# Patient Record
Sex: Female | Born: 1985
Health system: Southern US, Community
[De-identification: ages and names within clinical notes are randomized; demographics above are authoritative.]

## PROBLEM LIST (undated history)

## (undated) DIAGNOSIS — F419 Anxiety disorder, unspecified: Secondary | ICD-10-CM

## (undated) DIAGNOSIS — R519 Headache, unspecified: Secondary | ICD-10-CM

## (undated) DIAGNOSIS — R51 Headache: Secondary | ICD-10-CM

## (undated) HISTORY — PX: WISDOM TOOTH EXTRACTION: SHX21

## (undated) HISTORY — PX: DILATION AND CURETTAGE OF UTERUS: SHX78

## (undated) HISTORY — PX: UPPER GI ENDOSCOPY: SHX6162

---

## 2004-06-15 ENCOUNTER — Emergency Department (HOSPITAL_COMMUNITY): Admission: EM | Admit: 2004-06-15 | Discharge: 2004-06-15 | Payer: Self-pay | Admitting: Emergency Medicine

## 2005-04-01 ENCOUNTER — Emergency Department (HOSPITAL_COMMUNITY): Admission: EM | Admit: 2005-04-01 | Discharge: 2005-04-01 | Payer: Self-pay | Admitting: Emergency Medicine

## 2006-02-09 IMAGING — CR DG CHEST 2V
2 series · 2 of 2 positions shown · non-contrast
Comparison: none

CLINICAL DATA: Cough.
CHEST-TWO VIEW: 
No evidence of infiltrate, edema, or pleural effusion.  Heart size and mediastinal contours are within normal limits. Vertebral bodies in the lateral projection show some increase in density as well as irregular endplates.  Correlation suggested with any underlying osteodystrophy such as secondary to sickle cell anemia or renal disease.

[view not recorded (1 of 2)]
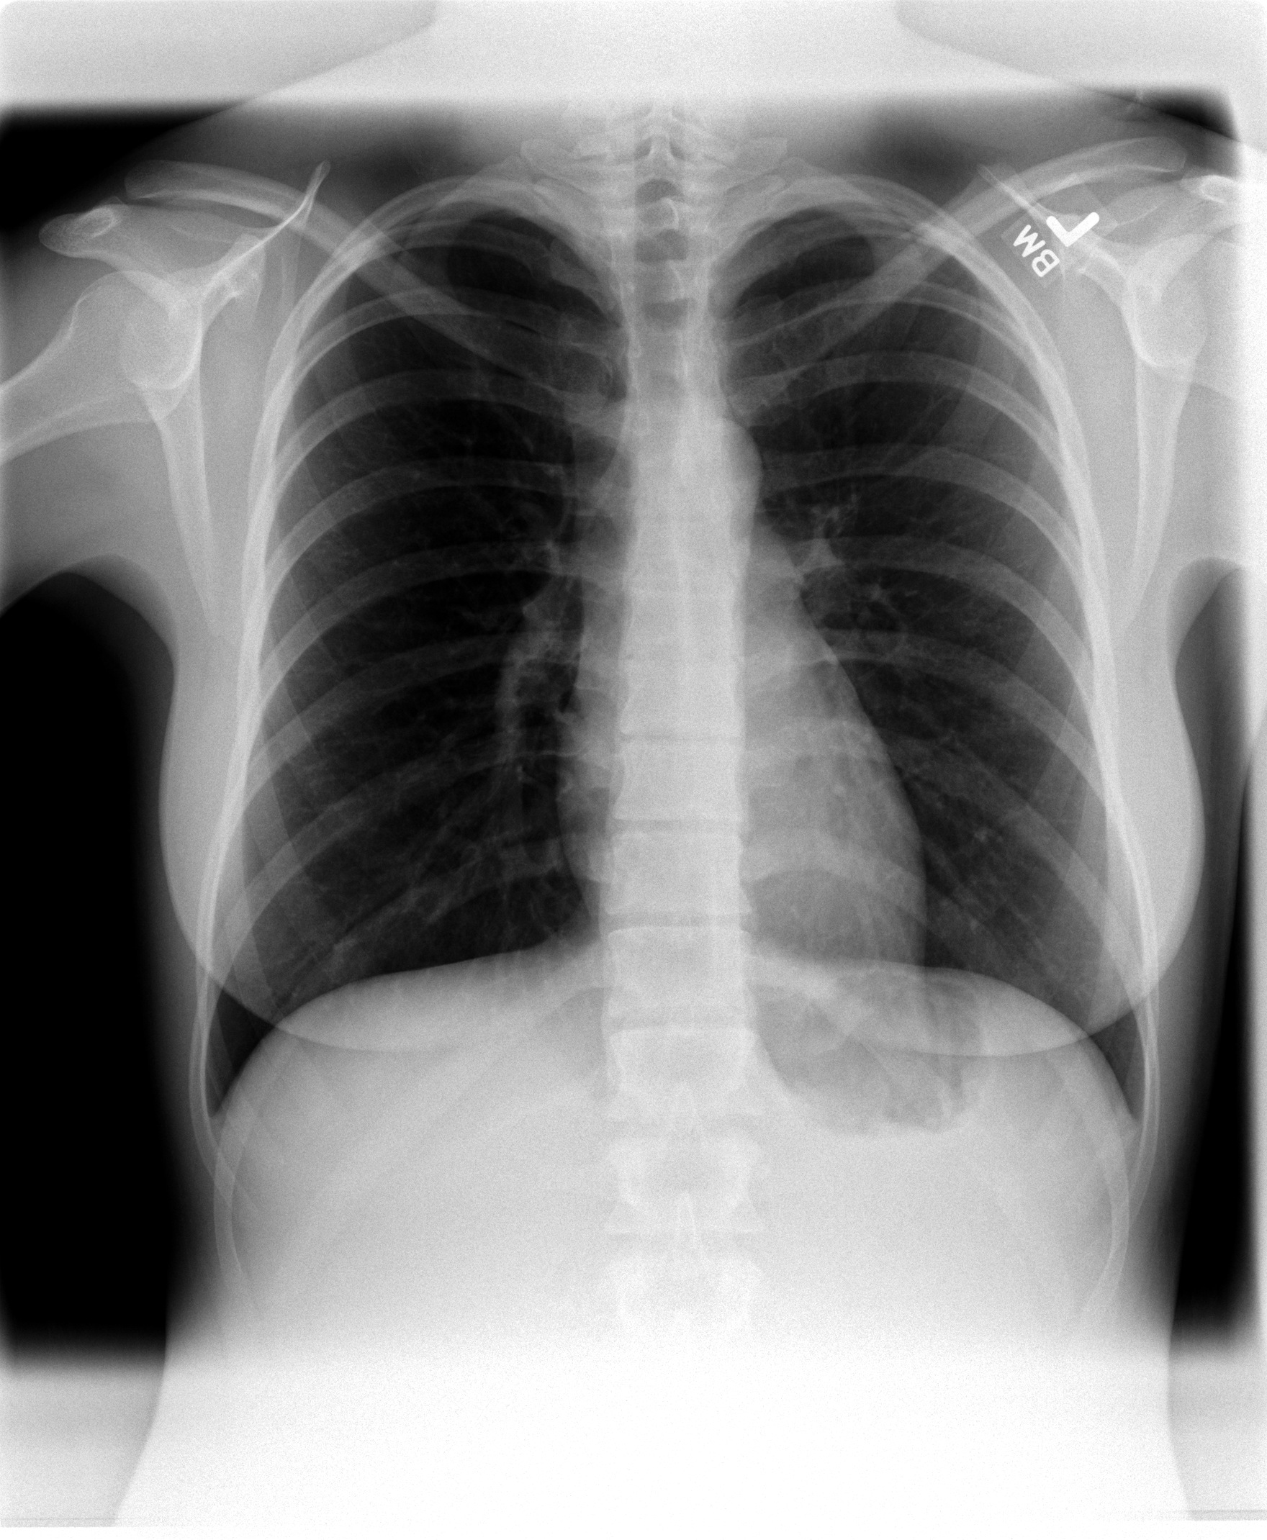

[view not recorded (2 of 2)]
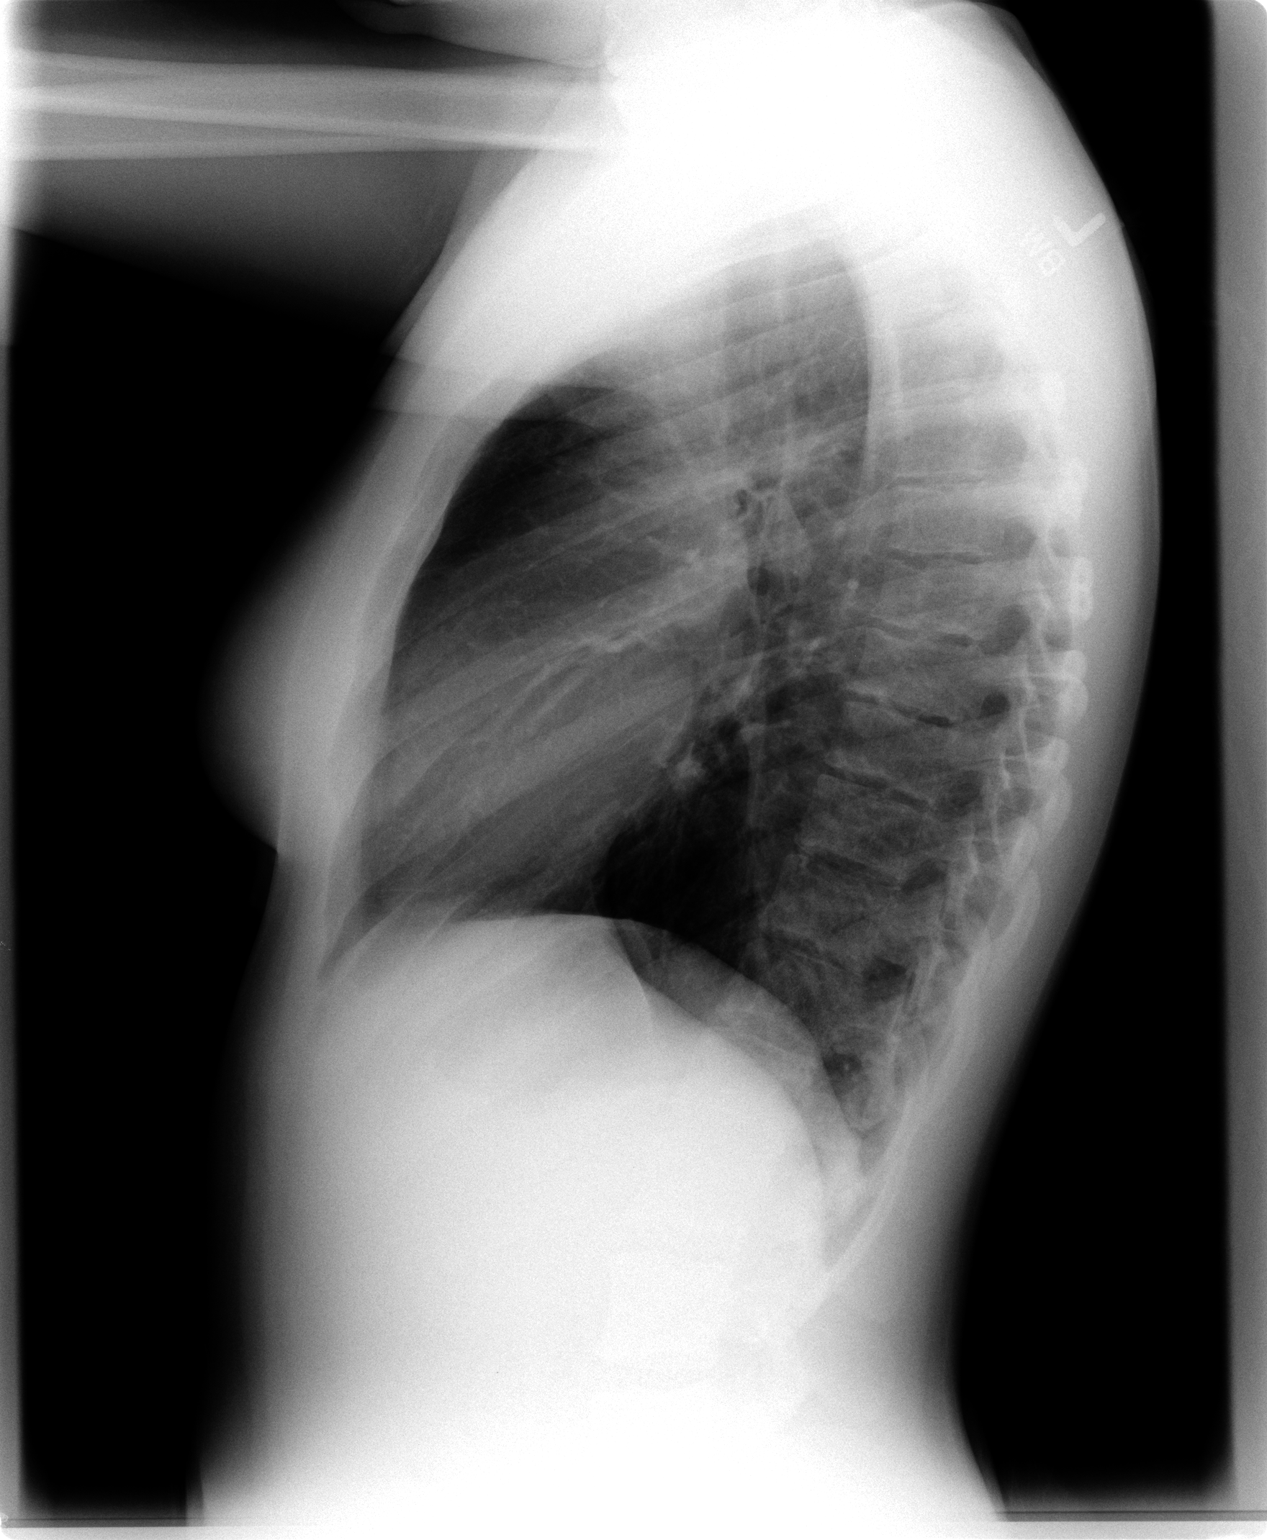

[2 of 2 positions shown; findings below may reference images not displayed]

IMPRESSION: No active disease in the chest.  Some irregularity and increased density of vertebral bodies noted in the thoracic spine. This does raise the possibility of underlying osteodystrophy.

## 2016-08-18 ENCOUNTER — Telehealth: Payer: Self-pay

## 2016-08-18 NOTE — Telephone Encounter (Signed)
SENT NOTES TO SCHEDULING 

## 2017-08-11 ENCOUNTER — Other Ambulatory Visit: Payer: Self-pay | Admitting: Nurse Practitioner

## 2017-08-11 ENCOUNTER — Other Ambulatory Visit (HOSPITAL_COMMUNITY)
Admission: RE | Admit: 2017-08-11 | Discharge: 2017-08-11 | Disposition: A | Discharge status: Home / Self Care | Payer: BLUE CROSS/BLUE SHIELD | Source: Ambulatory Visit | Attending: Nurse Practitioner | Admitting: Nurse Practitioner

## 2017-08-11 DIAGNOSIS — Z01419 Encounter for gynecological examination (general) (routine) without abnormal findings: Secondary | ICD-10-CM | POA: Diagnosis not present

## 2017-08-17 LAB — CYTOLOGY - PAP
CHLAMYDIA, DNA PROBE: NEGATIVE
DIAGNOSIS: UNDETERMINED — AB
HPV 16/18/45 genotyping: POSITIVE — AB
HPV: DETECTED — AB
Neisseria Gonorrhea: NEGATIVE

## 2017-08-19 ENCOUNTER — Other Ambulatory Visit: Payer: Self-pay | Admitting: Nurse Practitioner

## 2017-08-24 ENCOUNTER — Other Ambulatory Visit: Payer: Self-pay | Admitting: Obstetrics & Gynecology

## 2017-09-16 ENCOUNTER — Other Ambulatory Visit: Payer: Self-pay | Admitting: Obstetrics & Gynecology

## 2017-10-10 ENCOUNTER — Encounter (HOSPITAL_COMMUNITY): Payer: Self-pay | Admitting: *Deleted

## 2017-10-10 ENCOUNTER — Other Ambulatory Visit: Payer: Self-pay
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
## 2017-11-08 ENCOUNTER — Encounter (HOSPITAL_COMMUNITY): Payer: Self-pay | Admitting: Anesthesiology

## 2017-11-09 ENCOUNTER — Ambulatory Visit (HOSPITAL_COMMUNITY)
Admission: AD | Admit: 2017-11-09 | Payer: BLUE CROSS/BLUE SHIELD | Source: Ambulatory Visit | Admitting: Obstetrics & Gynecology

## 2017-11-09 HISTORY — DX: Headache, unspecified: R51.9

## 2017-11-09 HISTORY — DX: Anxiety disorder, unspecified: F41.9

## 2017-11-09 HISTORY — DX: Headache: R51

## 2017-11-09 SURGERY — DILATATION & CURETTAGE/HYSTEROSCOPY WITH HYDROTHERMAL ABLATION
Anesthesia: Choice

## 2017-11-09 MED ORDER — FENTANYL CITRATE (PF) 100 MCG/2ML IJ SOLN
INTRAMUSCULAR | Status: AC
  Filled 2017-11-09: qty 2

## 2017-11-09 MED ORDER — LIDOCAINE HCL (CARDIAC) PF 100 MG/5ML IV SOSY
PREFILLED_SYRINGE | INTRAVENOUS | Status: AC
  Filled 2017-11-09: qty 5

## 2017-11-09 MED ORDER — MIDAZOLAM HCL 2 MG/2ML IJ SOLN
INTRAMUSCULAR | Status: AC
  Filled 2017-11-09: qty 2

## 2017-11-09 MED ORDER — ONDANSETRON HCL 4 MG/2ML IJ SOLN
INTRAMUSCULAR | Status: AC
  Filled 2017-11-09: qty 2

## 2017-11-09 MED ORDER — PROPOFOL 10 MG/ML IV BOLUS
INTRAVENOUS | Status: AC
  Filled 2017-11-09: qty 20

## 2022-06-10 ENCOUNTER — Encounter: Admit: 2022-06-10 | Discharge: 2022-06-10

## 2022-06-10 NOTE — Telephone Encounter
Attempted to reach patient regarding records for upcoming appointment.     Future Appointments   Date Time Provider [facility]   08/05/2022  1:15 PM Tessa Lerner, MD [facility]     No answer. Left message requesting GYN records be sent to [place] at [phone], or that patient return call with information about past GYN care so we can get records requested.

## 2022-07-16 ENCOUNTER — Encounter: Admit: 2022-07-16 | Discharge: 2022-07-16

## 2022-07-16 NOTE — Telephone Encounter
Spoke with pt, informed that appointment on 08/05/22 has been switched to 08/06/22, v/u

## 2022-07-19 ENCOUNTER — Encounter: Admit: 2022-07-19 | Discharge: 2022-07-19

## 2022-07-20 ENCOUNTER — Encounter: Admit: 2022-07-20 | Discharge: 2022-07-20

## 2022-07-20 DIAGNOSIS — N939 Abnormal uterine and vaginal bleeding, unspecified: Secondary | ICD-10-CM

## 2022-07-20 DIAGNOSIS — Z1231 Encounter for screening mammogram for malignant neoplasm of breast: Secondary | ICD-10-CM

## 2022-07-20 DIAGNOSIS — Z124 Encounter for screening for malignant neoplasm of cervix: Secondary | ICD-10-CM

## 2022-08-05 ENCOUNTER — Encounter: Admit: 2022-08-05 | Discharge: 2022-08-05

## 2022-08-06 ENCOUNTER — Ambulatory Visit: Admit: 2022-08-06 | Discharge: 2022-08-06 | Primary: Family

## 2022-08-06 ENCOUNTER — Encounter: Admit: 2022-08-06 | Discharge: 2022-08-06 | Primary: Family

## 2022-08-06 DIAGNOSIS — B977 Papillomavirus as the cause of diseases classified elsewhere: Secondary | ICD-10-CM

## 2022-08-06 DIAGNOSIS — Z124 Encounter for screening for malignant neoplasm of cervix: Secondary | ICD-10-CM

## 2022-08-06 DIAGNOSIS — N951 Menopausal and female climacteric states: Secondary | ICD-10-CM

## 2022-08-06 DIAGNOSIS — G43909 Migraine, unspecified, not intractable, without status migrainosus: Secondary | ICD-10-CM

## 2022-08-06 DIAGNOSIS — N939 Abnormal uterine and vaginal bleeding, unspecified: Secondary | ICD-10-CM

## 2022-08-06 DIAGNOSIS — E282 Polycystic ovarian syndrome: Secondary | ICD-10-CM

## 2022-08-06 DIAGNOSIS — T7411XA Adult physical abuse, confirmed, initial encounter: Secondary | ICD-10-CM

## 2022-08-06 DIAGNOSIS — R87629 Unspecified abnormal cytological findings in specimens from vagina: Secondary | ICD-10-CM

## 2022-08-06 DIAGNOSIS — N649 Disorder of breast, unspecified: Secondary | ICD-10-CM

## 2022-08-06 LAB — ESTRADIOL (E2): ESTRADIOL: 210 pg/mL

## 2022-08-06 LAB — THIN PREP HOLD LABEL

## 2022-08-06 LAB — FOLLICLE STIMULATING HORMONE: FSH: 3.7 mU/mL

## 2023-07-21 ENCOUNTER — Encounter: Admit: 2023-07-21 | Discharge: 2023-07-21 | Primary: Family

## 2023-07-23 ENCOUNTER — Ambulatory Visit: Admit: 2023-07-23 | Discharge: 2023-07-23 | Primary: Family

## 2023-07-23 ENCOUNTER — Encounter: Admit: 2023-07-23 | Discharge: 2023-07-23 | Primary: Family

## 2023-07-25 ENCOUNTER — Encounter: Admit: 2023-07-25 | Discharge: 2023-07-25 | Primary: Family

## 2023-08-03 ENCOUNTER — Ambulatory Visit: Admit: 2023-08-03 | Discharge: 2023-08-03 | Payer: TRICARE (CHAMPUS) | Primary: Family

## 2023-08-03 ENCOUNTER — Encounter: Admit: 2023-08-03 | Discharge: 2023-08-03 | Payer: TRICARE (CHAMPUS) | Primary: Family
